# Patient Record
Sex: Male | Born: 1980 | Race: Black or African American | Hispanic: No | Marital: Single | Smoking: Former smoker
Health system: Southern US, Community
[De-identification: ages and names within clinical notes are randomized; demographics above are authoritative.]

## PROBLEM LIST (undated history)

## (undated) DIAGNOSIS — I10 Essential (primary) hypertension: Secondary | ICD-10-CM

## (undated) HISTORY — PX: CHOLECYSTECTOMY: SHX55

---

## 2000-09-19 ENCOUNTER — Emergency Department (HOSPITAL_COMMUNITY): Admission: EM | Admit: 2000-09-19 | Discharge: 2000-09-20 | Payer: Self-pay | Admitting: *Deleted

## 2014-10-04 ENCOUNTER — Encounter (HOSPITAL_COMMUNITY): Payer: Self-pay | Admitting: Emergency Medicine

## 2014-10-04 ENCOUNTER — Emergency Department (HOSPITAL_COMMUNITY)
Admission: EM | Admit: 2014-10-04 | Discharge: 2014-10-04 | Disposition: A | Discharge status: Home / Self Care | Payer: Medicaid - Out of State | Attending: Emergency Medicine | Admitting: Emergency Medicine

## 2014-10-04 DIAGNOSIS — I10 Essential (primary) hypertension: Secondary | ICD-10-CM | POA: Diagnosis not present

## 2014-10-04 DIAGNOSIS — Z760 Encounter for issue of repeat prescription: Secondary | ICD-10-CM | POA: Diagnosis present

## 2014-10-04 DIAGNOSIS — Z87891 Personal history of nicotine dependence: Secondary | ICD-10-CM | POA: Insufficient documentation

## 2014-10-04 HISTORY — DX: Essential (primary) hypertension: I10

## 2014-10-04 MED ORDER — LOSARTAN POTASSIUM 100 MG PO TABS: 100.0000 mg | ORAL_TABLET | Freq: Every day | ORAL | Status: AC

## 2014-10-04 MED ORDER — AMLODIPINE BESYLATE 10 MG PO TABS: 10.0000 mg | ORAL_TABLET | Freq: Every day | ORAL | Status: AC

## 2014-10-04 NOTE — Discharge Instructions (Signed)
Continue your blood pressure medications. Continue to watch your diet and exercise. Follow up with your doctor as soon as able.   Hypertension Hypertension, commonly called high blood pressure, is when the force of blood pumping through your arteries is too strong. Your arteries are the blood vessels that carry blood from your heart throughout your body. A blood pressure reading consists of a higher number over a lower number, such as 110/72. The higher number (systolic) is the pressure inside your arteries when your heart pumps. The lower number (diastolic) is the pressure inside your arteries when your heart relaxes. Ideally you want your blood pressure below 120/80. Hypertension forces your heart to work harder to pump blood. Your arteries may become narrow or stiff. Having hypertension puts you at risk for heart disease, stroke, and other problems.  RISK FACTORS Some risk factors for high blood pressure are controllable. Others are not.  Risk factors you cannot control include:   Race. You may be at higher risk if you are African American.  Age. Risk increases with age.  Gender. Men are at higher risk than women before age 34 years. After age 34, women are at higher risk than men. Risk factors you can control include:  Not getting enough exercise or physical activity.  Being overweight.  Getting too much fat, sugar, calories, or salt in your diet.  Drinking too much alcohol. SIGNS AND SYMPTOMS Hypertension does not usually cause signs or symptoms. Extremely high blood pressure (hypertensive crisis) may cause headache, anxiety, shortness of breath, and nosebleed. DIAGNOSIS  To check if you have hypertension, your health care provider will measure your blood pressure while you are seated, with your arm held at the level of your heart. It should be measured at least twice using the same arm. Certain conditions can cause a difference in blood pressure between your right and left arms. A  blood pressure reading that is higher than normal on one occasion does not mean that you need treatment. If one blood pressure reading is high, ask your health care provider about having it checked again. TREATMENT  Treating high blood pressure includes making lifestyle changes and possibly taking medicine. Living a healthy lifestyle can help lower high blood pressure. You may need to change some of your habits. Lifestyle changes may include:  Following the DASH diet. This diet is high in fruits, vegetables, and whole grains. It is low in salt, red meat, and added sugars.  Getting at least 2 hours of brisk physical activity every week.  Losing weight if necessary.  Not smoking.  Limiting alcoholic beverages.  Learning ways to reduce stress. If lifestyle changes are not enough to get your blood pressure under control, your health care provider may prescribe medicine. You may need to take more than one. Work closely with your health care provider to understand the risks and benefits. HOME CARE INSTRUCTIONS  Have your blood pressure rechecked as directed by your health care provider.   Take medicines only as directed by your health care provider. Follow the directions carefully. Blood pressure medicines must be taken as prescribed. The medicine does not work as well when you skip doses. Skipping doses also puts you at risk for problems.   Do not smoke.   Monitor your blood pressure at home as directed by your health care provider. SEEK MEDICAL CARE IF:   You think you are having a reaction to medicines taken.  You have recurrent headaches or feel dizzy.  You have swelling  in your ankles.  You have trouble with your vision. SEEK IMMEDIATE MEDICAL CARE IF:  You develop a severe headache or confusion.  You have unusual weakness, numbness, or feel faint.  You have severe chest or abdominal pain.  You vomit repeatedly.  You have trouble breathing. MAKE SURE YOU:    Understand these instructions.  Will watch your condition.  Will get help right away if you are not doing well or get worse. Document Released: 12/15/2005 Document Revised: 05/01/2014 Document Reviewed: 10/07/2013 [facility] Patient Information 2015 [place], [place]. This information is not intended to replace advice given to you by your health care provider. Make sure you discuss any questions you have with your health care provider.

## 2014-10-04 NOTE — ED Provider Notes (Signed)
CSN: [id number]     Arrival date & time 10/04/14  1338 History  This chart was scribed for non-physician practitioner, Lottie Musselatyana A Melane Windholz, PA-C, working with Enid SkeensJoshua M Zavitz, MD by Charline BillsEssence Howell, ED Scribe. This patient was seen in room TR08C/TR08C and the patient's care was started at 1:55 PM.   Chief Complaint  Patient presents with  . Medication Refill   The history is provided by the patient. No language interpreter was used.   HPI Comments: Timothy Hart is a 33 y.o. male, with a h/o HTN, who presents to the Emergency Department for a medication refill. Pt states that his prescription expired and he tried to contact [place]CP in [place] but he moved. Pt is unable to get a refill of Losartan 100mg  x1 daily and 10mg  amlodipine x1 daily. Last dose over a week ago. Pt states that he has been eating healthy and exercising. Pt reports that his father has a h/o stroke. Pt denies tobacco use.   Past Medical History  Diagnosis Date  . Hypertension    Past Surgical History  Procedure Laterality Date  . Cholecystectomy     No family history on file. History  Substance Use Topics  . Smoking status: Former Games developermoker  . Smokeless tobacco: Not on file  . Alcohol Use: No    Review of Systems  All other systems reviewed and are negative.  Allergies  Review of patient's allergies indicates no known allergies.  Home Medications   Prior to Admission medications   Not on File   Triage Vitals: 135/94  Pulse 67  Temp(Src) 98 F (36.7 C) (Oral)  Resp 16  Ht 5\' 11"  (1.803 m)  Wt 229 lb (103.874 kg)  BMI 31.95 kg/m2  SpO2 99% Physical Exam  Nursing note and vitals reviewed. Constitutional: He is oriented to person, place, and time. He appears well-developed and well-nourished.  HENT:  Head: Normocephalic and atraumatic.  Eyes: Conjunctivae and EOM are normal.  Neck: Neck supple.  Cardiovascular: Normal rate and regular rhythm.   Pulmonary/Chest: Effort normal and breath sounds  normal.  Musculoskeletal: Normal range of motion.  Neurological: He is alert and oriented to person, place, and time.  Skin: Skin is warm and dry.  Psychiatric: He has a normal mood and affect. His behavior is normal.   ED Course  Procedures (including critical care time) DIAGNOSTIC STUDIES: Oxygen Saturation is 99% on RA, normal by my interpretation.    COORDINATION OF CARE: 2:01 PM-Discussed treatment plan which includes refill with pt at bedside and pt agreed to plan.   Labs Review Labs Reviewed - No data to display  Imaging Review No results found.   EKG Interpretation None      MDM   Final diagnoses:  Medication refill    Should is here with no complaints requesting blood pressure medication refill. He is from [place] here for a job. Patient takes Norvasc and losartan. Will refill. Followup with her primary care doctor's in as able.  Filed Vitals:   10/04/14 1345  BP: 135/94  Pulse: 67  Temp: 98 F (36.7 C)  TempSrc: Oral  Resp: 16  Height: 5\' 11"  (1.803 m)  Weight: 229 lb (103.874 kg)  SpO2: 99%     I personally performed the services described in this documentation, which was scribed in my presence. The recorded information has been reviewed and is accurate.    Lottie Musselatyana A Deantre Bourdon, PA-C 10/04/14 1633

## 2014-10-04 NOTE — ED Notes (Signed)
Pt states that his pcp doctor has moved and has not been able to get a refill for blood pressure medications x1 week, was told to come to ER for medication refill. Pt denies any pain at this time, states head congestion from a cold. Denies any numbness/tingling/ to extremities. No neuro deficits noted.

## 2014-10-04 NOTE — ED Notes (Signed)
Declined W/C at D/C and was escorted to lobby by RN. 

## 2014-10-05 NOTE — ED Provider Notes (Signed)
Medical screening examination/treatment/procedure(s) were performed by non-physician practitioner and as supervising physician I was immediately available for consultation/collaboration.   EKG Interpretation None        Paiten Boies M Isaura Schiller, MD 10/05/14 0716 

## 2014-10-16 ENCOUNTER — Encounter (HOSPITAL_COMMUNITY): Payer: Self-pay | Admitting: Emergency Medicine

## 2014-10-16 ENCOUNTER — Emergency Department (HOSPITAL_COMMUNITY)
Admission: EM | Admit: 2014-10-16 | Discharge: 2014-10-16 | Disposition: A | Discharge status: Home / Self Care | Payer: Medicaid - Out of State | Attending: Emergency Medicine | Admitting: Emergency Medicine

## 2014-10-16 ENCOUNTER — Emergency Department (HOSPITAL_COMMUNITY): Payer: Medicaid - Out of State

## 2014-10-16 DIAGNOSIS — Z79899 Other long term (current) drug therapy: Secondary | ICD-10-CM | POA: Diagnosis not present

## 2014-10-16 DIAGNOSIS — I10 Essential (primary) hypertension: Secondary | ICD-10-CM | POA: Insufficient documentation

## 2014-10-16 DIAGNOSIS — Z87891 Personal history of nicotine dependence: Secondary | ICD-10-CM | POA: Insufficient documentation

## 2014-10-16 DIAGNOSIS — Z791 Long term (current) use of non-steroidal anti-inflammatories (NSAID): Secondary | ICD-10-CM | POA: Insufficient documentation

## 2014-10-16 DIAGNOSIS — R519 Headache, unspecified: Secondary | ICD-10-CM

## 2014-10-16 DIAGNOSIS — R51 Headache: Secondary | ICD-10-CM | POA: Diagnosis present

## 2014-10-16 MED ORDER — KETOROLAC TROMETHAMINE 10 MG PO TABS
10.0000 mg | ORAL_TABLET | Freq: Once | ORAL | Status: AC
  Administered 2014-10-16: 10 mg via ORAL
  Filled 2014-10-16: qty 1

## 2014-10-16 MED ORDER — KETOROLAC TROMETHAMINE 10 MG PO TABS: 10.0000 mg | ORAL_TABLET | Freq: Three times a day (TID) | ORAL | Status: AC

## 2014-10-16 NOTE — ED Notes (Signed)
Pt had somewhere to be, refused vitals and signature but took paperwork.

## 2014-10-16 NOTE — ED Notes (Signed)
Pt reports head pressure for the past 5 days. Pressure generalized over head. Pressure decreases with aleve. Light and sound sensitivity. Pt reports he has been under increased stress recently.

## 2014-10-16 NOTE — Discharge Instructions (Signed)

## 2014-10-16 NOTE — ED Provider Notes (Signed)
CSN: [id number]     Arrival date & time 10/16/14  1410 History   First MD Initiated Contact with Patient 10/16/14 1653     Chief Complaint  Patient presents with  . Headache     (Consider location/radiation/quality/duration/timing/severity/associated sxs/prior Treatment) Patient is a 33 y.o. male presenting with headaches. The history is provided by the patient.  Headache Pain location:  L parietal Quality:  Dull Radiates to:  Does not radiate Onset quality:  Gradual Duration:  4 days Timing:  Intermittent Progression:  Unchanged Chronicity:  New Similar to prior headaches: no   Context: emotional stress   Relieved by:  NSAIDs Ineffective treatments:  None tried Associated symptoms: no cough and no fever     Past Medical History  Diagnosis Date  . Hypertension    Past Surgical History  Procedure Laterality Date  . Cholecystectomy     History reviewed. No pertinent family history. History  Substance Use Topics  . Smoking status: Former Games developermoker  . Smokeless tobacco: Not on file  . Alcohol Use: No    Review of Systems  Constitutional: Negative for fever and chills.  Respiratory: Negative for cough and shortness of breath.   Neurological: Positive for headaches.  All other systems reviewed and are negative.     Allergies  Review of patient's allergies indicates no known allergies.  Home Medications   Prior to Admission medications   Medication Sig Start Date End Date Taking? Authorizing Provider  amLODipine (NORVASC) 10 MG tablet Take 1 tablet (10 mg total) by mouth daily. 10/04/14  Yes Tatyana A Kirichenko, PA-C  losartan (COZAAR) 100 MG tablet Take 1 tablet (100 mg total) by mouth daily. 10/04/14  Yes Tatyana A Kirichenko, PA-C  naproxen sodium (ANAPROX) 220 MG tablet Take 440 mg by mouth 2 (two) times daily with a meal.   Yes Historical Provider, MD  Vitamin D, Ergocalciferol, (DRISDOL) 50000 UNITS CAPS capsule Take 50,000 Units by mouth every 7 (seven)  days.   Yes Historical Provider, MD   BP 146/102  Pulse 59  Temp(Src) 97.6 F (36.4 C) (Oral)  Resp 18  SpO2 100% Physical Exam  Constitutional: He is oriented to person, place, and time. He appears well-developed and well-nourished. No distress.  HENT:  Head: Normocephalic and atraumatic.  Mouth/Throat: No oropharyngeal exudate.  Eyes: EOM are normal. Pupils are equal, round, and reactive to light.  Neck: Normal range of motion. Neck supple.  Cardiovascular: Normal rate and regular rhythm.  Exam reveals no friction rub.   No murmur heard. Pulmonary/Chest: Effort normal and breath sounds normal. No respiratory distress. He has no wheezes. He has no rales.  Abdominal: Soft. He exhibits no distension. There is no tenderness. There is no rebound.  Musculoskeletal: Normal range of motion. He exhibits no edema.  Neurological: He is alert and oriented to person, place, and time. No cranial nerve deficit or sensory deficit. He exhibits normal muscle tone. Coordination and gait normal. GCS eye subscore is 4. GCS verbal subscore is 5. GCS motor subscore is 6.  Skin: No rash noted. He is not diaphoretic.    ED Course  Procedures (including critical care time) Labs Review Labs Reviewed - No data to display  Imaging Review Ct Head Wo Contrast  10/16/2014   CLINICAL DATA:  Head pressure for past 5 days. Head pressure is improved with naproxen.  EXAM: CT HEAD WITHOUT CONTRAST  TECHNIQUE: Contiguous axial images were obtained from the base of the skull through the vertex without intravenous  contrast.  COMPARISON:  None.  FINDINGS: Ventricles are normal in size and configuration. No parenchymal masses or mass effect. There is no evidence of an infarct.  There are no extra-axial masses or abnormal fluid collections.  There is no intracranial hemorrhage.  Visualized sinuses and mastoid air cells are clear. No skull lesions.  IMPRESSION: Normal unenhanced CT scan the brain.   Electronically Signed   By:  Amie Portlandavid  Ormond M.D.   On: 10/16/2014 18:46     EKG Interpretation None      MDM   Final diagnoses:  Acute nonintractable headache, unspecified headache type    37M presents with headache. States, "I feel something that wants to feel similar to a headache." Has a lot of stress with work, parents. No hx of headaches. Intermittent over past few days, states maybe some relief with aleve. Has family hx of brain cancer.  Nonfocal neurologic exam. Will scan head with family hx of brain cancer. CT Head ok. Stable for discharge. Has PCP in [place].    Elwin MochaBlair Joas Motton, MD 10/17/14 [phone]

## 2016-02-22 IMAGING — CT CT HEAD W/O CM
1 of 2 series · 16 of 30 positions shown, 20 images · non-contrast
Comparison: None.

CLINICAL DATA: Head pressure for past 5 days. Head pressure is
improved with naproxen.

EXAM:
CT HEAD WITHOUT CONTRAST
TECHNIQUE: Contiguous axial images were obtained from the base of the skull
through the vertex without intravenous contrast.

[Series 2: headseq 4.8 h45s · axial · 0.45mm/px · z∈[-133,-9]mm · 16 of 30 slices shown, 20 images]
[im 2/30  brain]
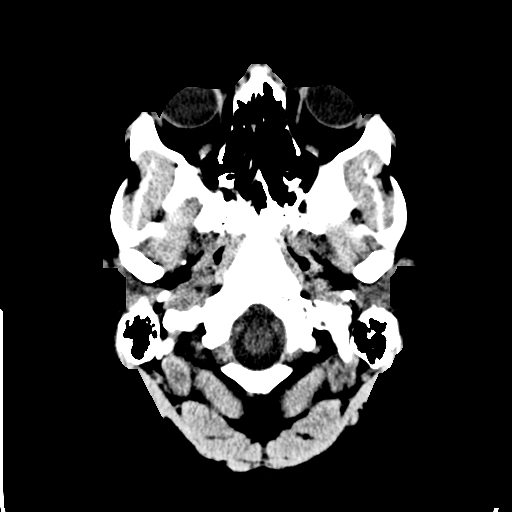
[im 2/30  bone]
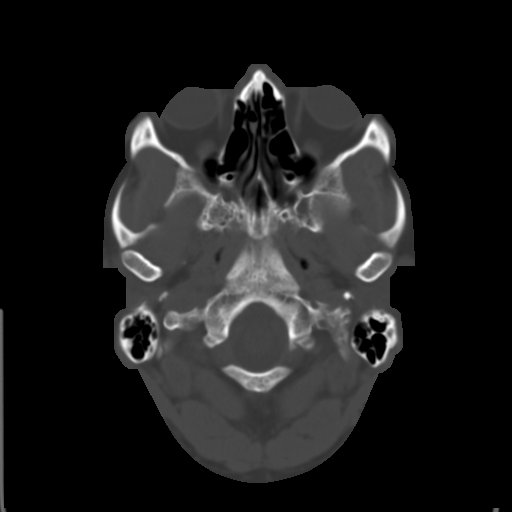
[im 4/30  brain]
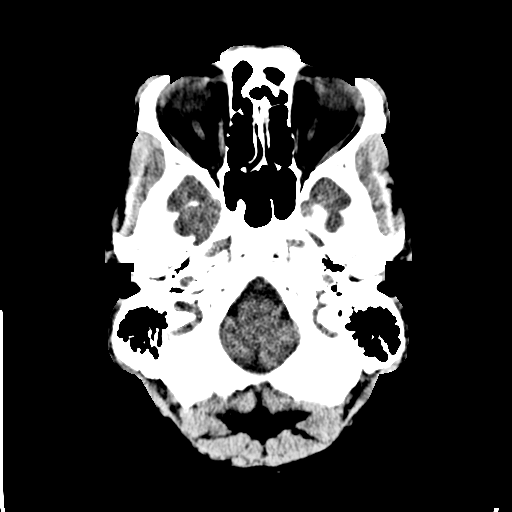
[im 5/30  brain]
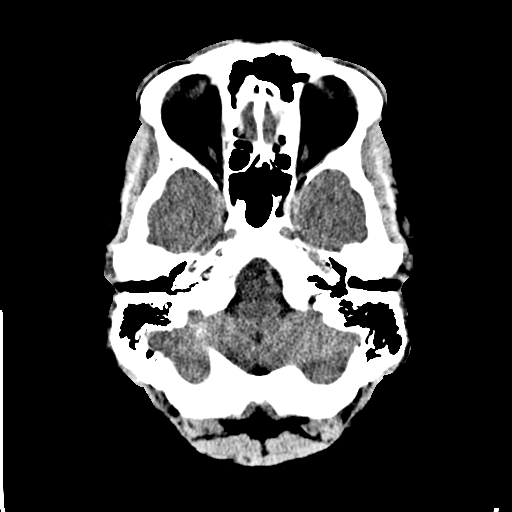
[im 8/30  brain]
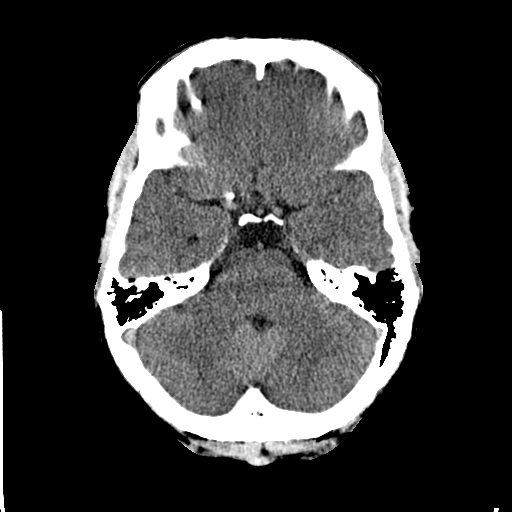
[im 9/30  brain]
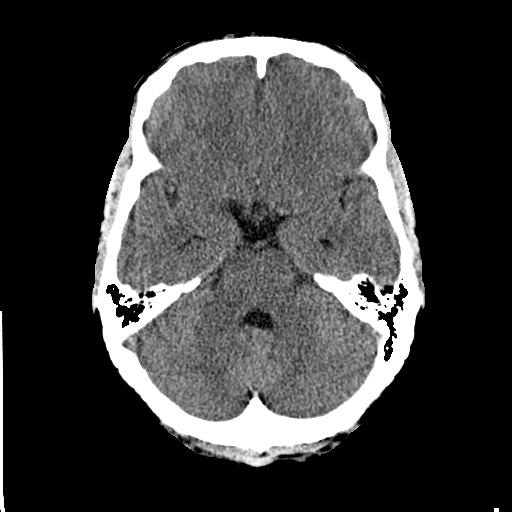
[im 9/30  bone]
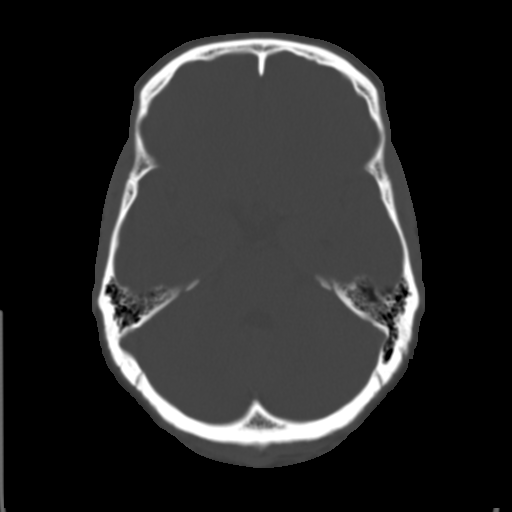
[im 10/30  brain]
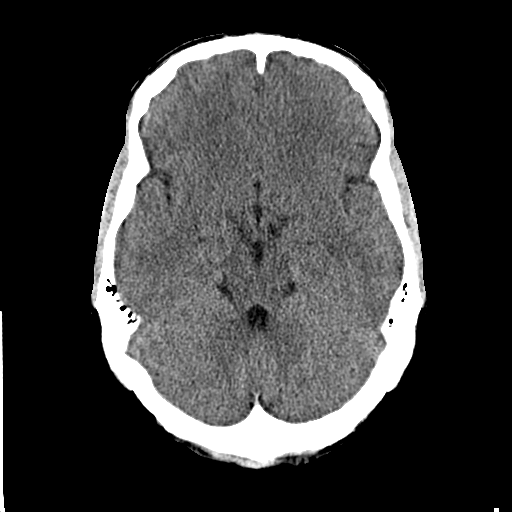
[im 13/30  brain]
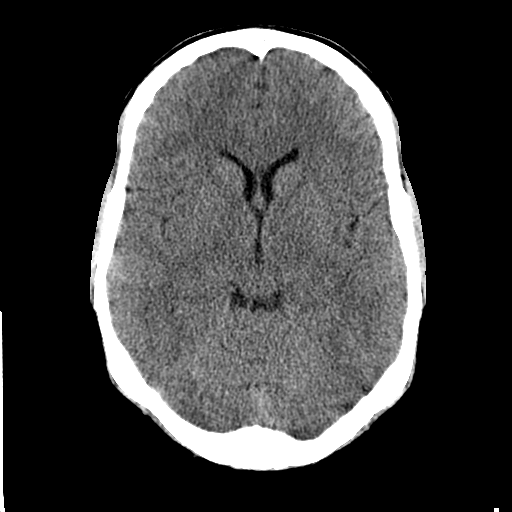
[im 14/30  brain]
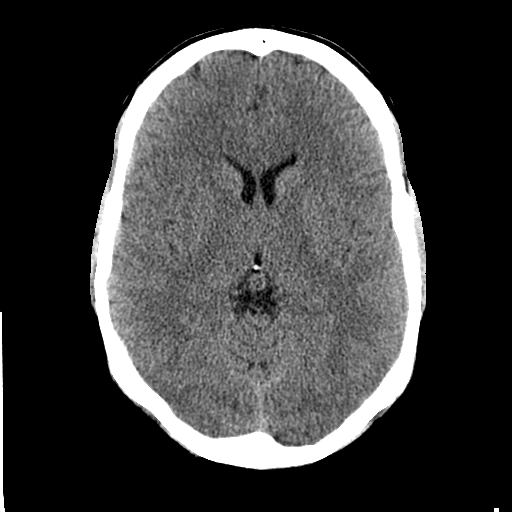
[im 16/30  brain]
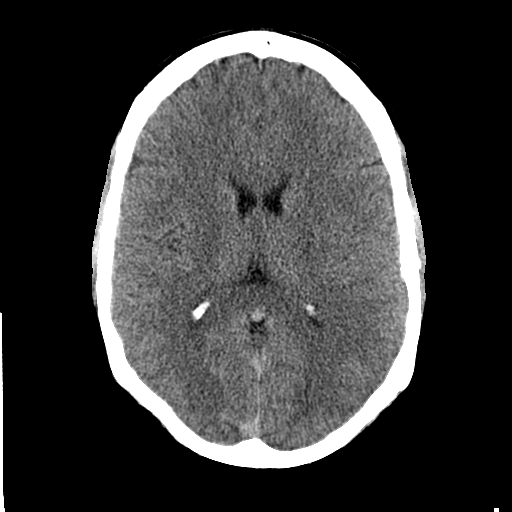
[im 16/30  bone]
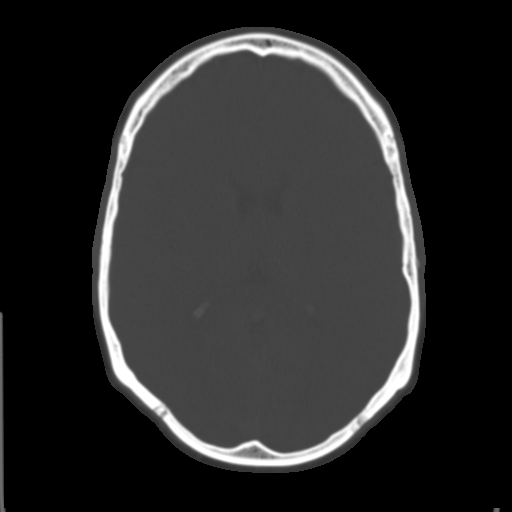
[im 17/30  brain]
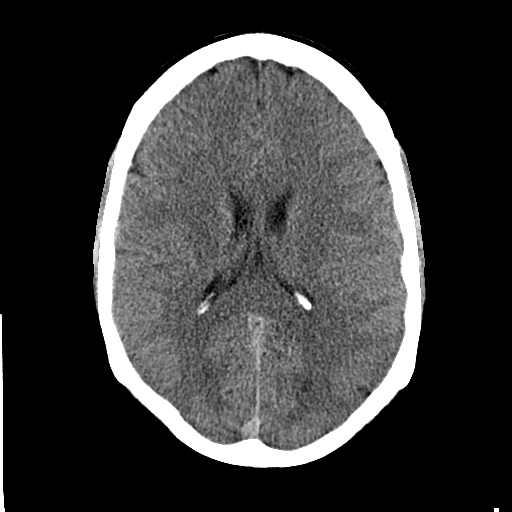
[im 20/30  brain]
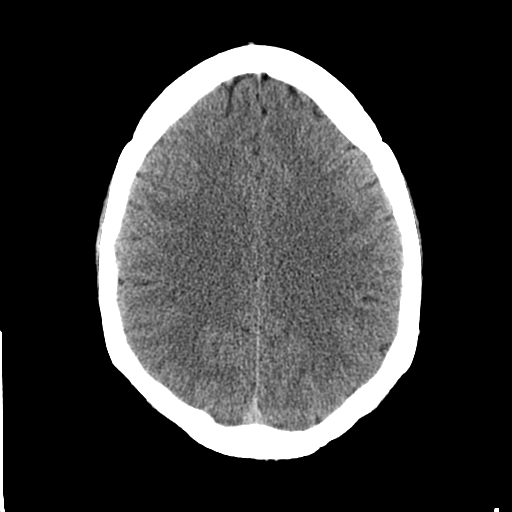
[im 21/30  brain]
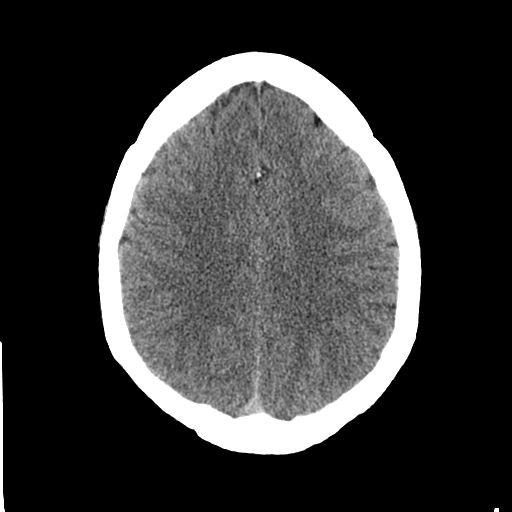
[im 22/30  brain]
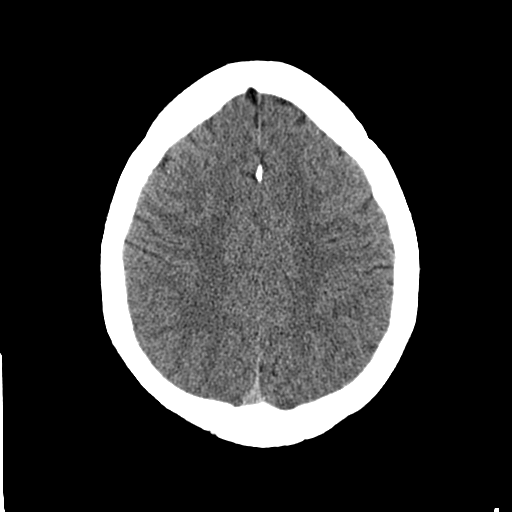
[im 22/30  bone]
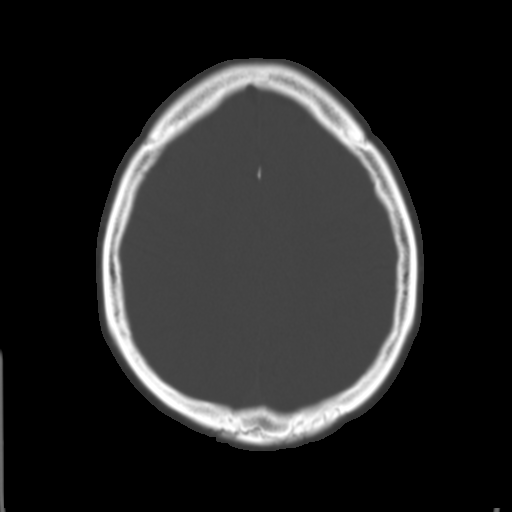
[im 25/30  brain]
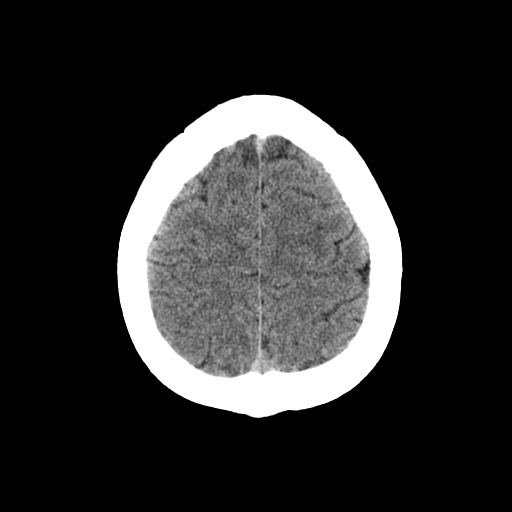
[im 26/30  brain]
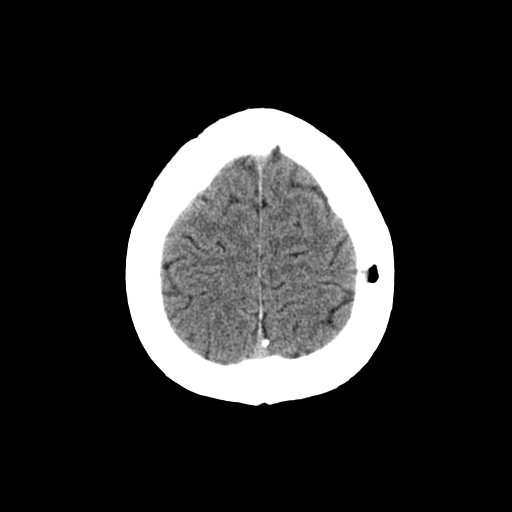
[im 28/30  brain]
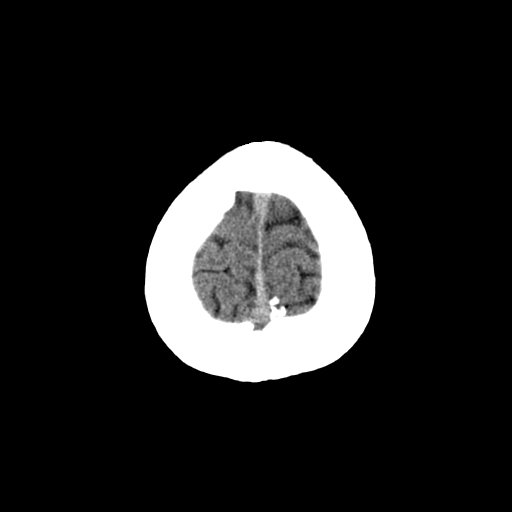

[16 of 30 positions shown; findings below may reference images not displayed]

FINDINGS: Ventricles are normal in size and configuration. No parenchymal
masses or mass effect. There is no evidence of an infarct.

There are no extra-axial masses or abnormal fluid collections.

There is no intracranial hemorrhage.

Visualized sinuses and mastoid air cells are clear. No skull
lesions.
IMPRESSION: Normal unenhanced CT scan the brain.
